# Patient Record
Sex: Male | Born: 1955 | Race: White | Hispanic: No | Marital: Single | Smoking: Never smoker
Health system: Southern US, Community
[De-identification: ages and names within clinical notes are randomized; demographics above are authoritative.]

---

## 1998-05-08 ENCOUNTER — Other Ambulatory Visit: Admission: RE | Admit: 1998-05-08 | Discharge: 1998-05-08 | Payer: Self-pay | Admitting: General Surgery

## 2002-04-27 ENCOUNTER — Emergency Department (HOSPITAL_COMMUNITY): Admission: EM | Admit: 2002-04-27 | Discharge: 2002-04-27 | Payer: Self-pay | Admitting: Emergency Medicine

## 2002-04-27 ENCOUNTER — Encounter: Payer: Self-pay | Admitting: Emergency Medicine

## 2002-10-28 ENCOUNTER — Encounter: Payer: Self-pay | Admitting: Family Medicine

## 2002-10-28 ENCOUNTER — Encounter: Admission: RE | Admit: 2002-10-28 | Discharge: 2002-10-28 | Payer: Self-pay | Admitting: Family Medicine

## 2003-10-06 DIAGNOSIS — D229 Melanocytic nevi, unspecified: Secondary | ICD-10-CM

## 2003-10-06 HISTORY — DX: Melanocytic nevi, unspecified: D22.9

## 2008-07-18 DIAGNOSIS — C4492 Squamous cell carcinoma of skin, unspecified: Secondary | ICD-10-CM

## 2008-07-18 HISTORY — DX: Squamous cell carcinoma of skin, unspecified: C44.92

## 2008-10-17 DIAGNOSIS — C4491 Basal cell carcinoma of skin, unspecified: Secondary | ICD-10-CM

## 2008-10-17 HISTORY — DX: Basal cell carcinoma of skin, unspecified: C44.91

## 2008-11-28 DIAGNOSIS — C4491 Basal cell carcinoma of skin, unspecified: Secondary | ICD-10-CM

## 2008-11-28 HISTORY — DX: Basal cell carcinoma of skin, unspecified: C44.91

## 2009-03-04 ENCOUNTER — Encounter: Admission: RE | Admit: 2009-03-04 | Discharge: 2009-03-04 | Payer: Self-pay | Admitting: Otolaryngology

## 2009-03-16 ENCOUNTER — Encounter: Admission: RE | Admit: 2009-03-16 | Discharge: 2009-03-16 | Payer: Self-pay | Admitting: Otolaryngology

## 2010-03-29 ENCOUNTER — Emergency Department (HOSPITAL_COMMUNITY)
Admission: EM | Admit: 2010-03-29 | Discharge: 2010-03-29 | Payer: Self-pay | Source: Home / Self Care | Admitting: Emergency Medicine

## 2010-03-30 ENCOUNTER — Encounter
Admission: RE | Admit: 2010-03-30 | Discharge: 2010-04-17 | Payer: Self-pay | Source: Home / Self Care | Attending: Family Medicine | Admitting: Family Medicine

## 2010-04-02 LAB — DIFFERENTIAL
Basophils Absolute: 0 10*3/uL (ref 0.0–0.1)
Basophils Relative: 0 % (ref 0–1)
Eosinophils Absolute: 0.1 10*3/uL (ref 0.0–0.7)
Eosinophils Relative: 1 % (ref 0–5)
Lymphocytes Relative: 34 % (ref 12–46)
Lymphs Abs: 2.2 10*3/uL (ref 0.7–4.0)
Monocytes Absolute: 0.6 10*3/uL (ref 0.1–1.0)
Monocytes Relative: 9 % (ref 3–12)
Neutro Abs: 3.4 10*3/uL (ref 1.7–7.7)
Neutrophils Relative %: 55 % (ref 43–77)

## 2010-04-02 LAB — CBC
HCT: 41.2 % (ref 39.0–52.0)
Hemoglobin: 14 g/dL (ref 13.0–17.0)
MCH: 29.5 pg (ref 26.0–34.0)
MCHC: 34 g/dL (ref 30.0–36.0)
MCV: 86.7 fL (ref 78.0–100.0)
Platelets: 295 10*3/uL (ref 150–400)
RBC: 4.75 MIL/uL (ref 4.22–5.81)
RDW: 13.1 % (ref 11.5–15.5)
WBC: 6.3 10*3/uL (ref 4.0–10.5)

## 2010-04-02 LAB — BASIC METABOLIC PANEL
BUN: 23 mg/dL (ref 6–23)
CO2: 27 mEq/L (ref 19–32)
Calcium: 9.3 mg/dL (ref 8.4–10.5)
Chloride: 106 mEq/L (ref 96–112)
Creatinine, Ser: 1.07 mg/dL (ref 0.4–1.5)
GFR calc Af Amer: >60 mL/min (ref >60)
GFR calc non Af Amer: >60 mL/min (ref >60)
Glucose, Bld: 104 mg/dL — ABNORMAL HIGH (ref 70–99)
Potassium: 4.1 mEq/L (ref 3.5–5.1)
Sodium: 140 mEq/L (ref 135–145)

## 2010-04-24 ENCOUNTER — Encounter: Payer: Self-pay | Admitting: Rehabilitative and Restorative Service Providers"

## 2010-05-07 ENCOUNTER — Ambulatory Visit
Payer: PRIVATE HEALTH INSURANCE | Attending: Family Medicine | Admitting: Rehabilitative and Restorative Service Providers"

## 2010-05-07 DIAGNOSIS — R42 Dizziness and giddiness: Secondary | ICD-10-CM | POA: Insufficient documentation

## 2010-05-07 DIAGNOSIS — R269 Unspecified abnormalities of gait and mobility: Secondary | ICD-10-CM | POA: Insufficient documentation

## 2010-05-07 DIAGNOSIS — IMO0001 Reserved for inherently not codable concepts without codable children: Secondary | ICD-10-CM | POA: Insufficient documentation

## 2010-08-01 ENCOUNTER — Ambulatory Visit: Payer: PRIVATE HEALTH INSURANCE | Attending: Neurology | Admitting: Physical Therapy

## 2010-08-01 DIAGNOSIS — M2569 Stiffness of other specified joint, not elsewhere classified: Secondary | ICD-10-CM | POA: Insufficient documentation

## 2010-08-01 DIAGNOSIS — M542 Cervicalgia: Secondary | ICD-10-CM | POA: Insufficient documentation

## 2010-08-01 DIAGNOSIS — IMO0001 Reserved for inherently not codable concepts without codable children: Secondary | ICD-10-CM | POA: Insufficient documentation

## 2010-08-06 ENCOUNTER — Ambulatory Visit: Payer: PRIVATE HEALTH INSURANCE | Admitting: Physical Therapy

## 2010-08-09 ENCOUNTER — Ambulatory Visit: Payer: PRIVATE HEALTH INSURANCE | Admitting: Physical Therapy

## 2010-08-15 ENCOUNTER — Encounter: Payer: PRIVATE HEALTH INSURANCE | Admitting: Physical Therapy

## 2010-08-20 ENCOUNTER — Ambulatory Visit: Payer: PRIVATE HEALTH INSURANCE | Attending: Neurology | Admitting: Physical Therapy

## 2010-08-20 DIAGNOSIS — IMO0001 Reserved for inherently not codable concepts without codable children: Secondary | ICD-10-CM | POA: Insufficient documentation

## 2010-08-20 DIAGNOSIS — M542 Cervicalgia: Secondary | ICD-10-CM | POA: Insufficient documentation

## 2010-08-20 DIAGNOSIS — M2569 Stiffness of other specified joint, not elsewhere classified: Secondary | ICD-10-CM | POA: Insufficient documentation

## 2010-08-23 ENCOUNTER — Ambulatory Visit: Payer: PRIVATE HEALTH INSURANCE | Admitting: Physical Therapy

## 2010-08-28 ENCOUNTER — Ambulatory Visit: Payer: PRIVATE HEALTH INSURANCE | Admitting: Physical Therapy

## 2010-08-30 ENCOUNTER — Ambulatory Visit: Payer: PRIVATE HEALTH INSURANCE | Admitting: Physical Therapy

## 2010-09-03 ENCOUNTER — Ambulatory Visit: Payer: PRIVATE HEALTH INSURANCE | Admitting: Physical Therapy

## 2010-09-06 ENCOUNTER — Ambulatory Visit: Payer: PRIVATE HEALTH INSURANCE | Admitting: Physical Therapy

## 2010-09-10 ENCOUNTER — Encounter: Payer: PRIVATE HEALTH INSURANCE | Admitting: Physical Therapy

## 2010-09-13 ENCOUNTER — Encounter: Payer: PRIVATE HEALTH INSURANCE | Admitting: Physical Therapy

## 2011-07-01 ENCOUNTER — Ambulatory Visit (INDEPENDENT_AMBULATORY_CARE_PROVIDER_SITE_OTHER): Payer: PRIVATE HEALTH INSURANCE | Admitting: Family Medicine

## 2011-07-01 VITALS — BP 131/81 | HR 62 | Temp 97.8°F | Resp 16 | Ht 70.0 in | Wt 218.0 lb

## 2011-07-01 DIAGNOSIS — J040 Acute laryngitis: Secondary | ICD-10-CM

## 2011-07-01 DIAGNOSIS — J069 Acute upper respiratory infection, unspecified: Secondary | ICD-10-CM

## 2011-07-01 MED ORDER — PREDNISONE 20 MG PO TABS: ORAL_TABLET | ORAL | Status: DC

## 2011-07-01 MED ORDER — AMOXICILLIN 875 MG PO TABS: 875.0000 mg | ORAL_TABLET | Freq: Two times a day (BID) | ORAL | Status: AC

## 2011-07-01 NOTE — Progress Notes (Signed)
Subjective: Patient gives a 5 day history of having a sore throat, then a couple days later developing laryngitis. He has been coughing some. Today his throat is more sore again. He is clearing his throat some. 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  Objective: TMs normal. Throat mildly erythematous. Neck supple without significant nodes. Chest clear. Heart regular. He is a little hoarse.  Assessment: Laryngitis and URI and probable sinusitis  Plan: Says has been going on for 5 days to treat with amoxicillin. A brief three-day course of prednisone 60 mg a day for 3 days. He can take the Mucinex he has at home. Return if worse. Thank you

## 2012-09-14 IMAGING — CT CT CERVICAL SPINE W/O CM
3 of 5 series · 9 of 20 positions shown, 10 images · non-contrast
Comparison: MR 03/04/2009

CT HEAD

CLINICAL DATA: *Trauma (Fall) - Pain Dizziness. PREVIOUS SURGICAL
THE CERVICAL FUSION

CT HEAD WITHOUT CONTRAST
CT CERVICAL SPINE WITHOUT CONTRAST
TECHNIQUE: Multidetector CT imaging of the head and cervical spine
was performed following the standard protocol without IV contrast.
Multiplanar CT image reconstructions of the cervical spine were
also generated.

[Series 5: c_spine 2.0 b31s · axial · 0.28mm/px · z∈[-280,-172]mm · 3 of 108 slices shown]
[im 27/108  bone]
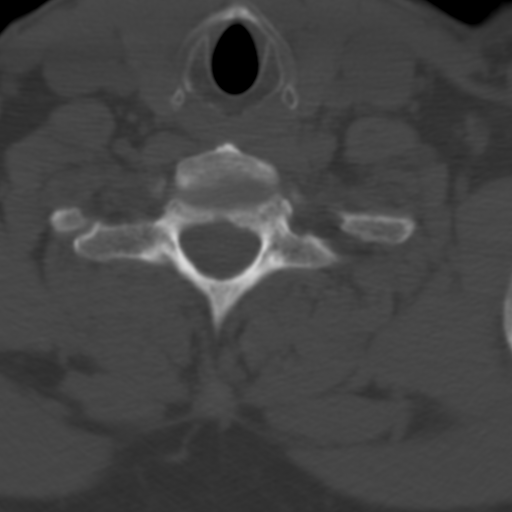
[im 54/108  bone]
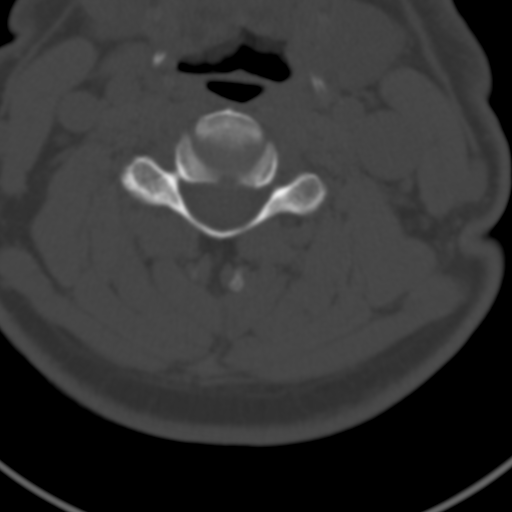
[im 81/108  bone]
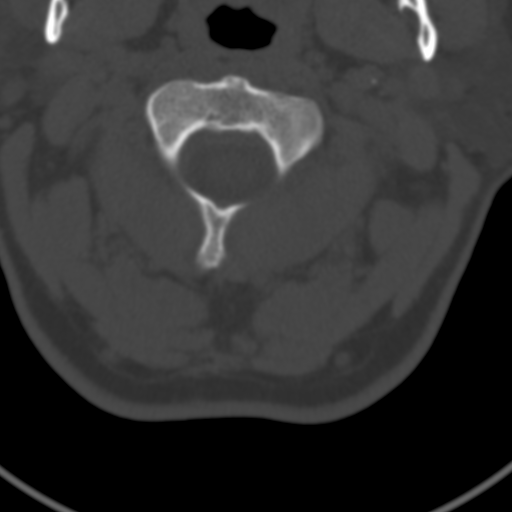

[Series 604: <mpr thick range(2)> · coronal · 0.42mm/px · 3 of 43 slices shown]
[im 9/43  bone]
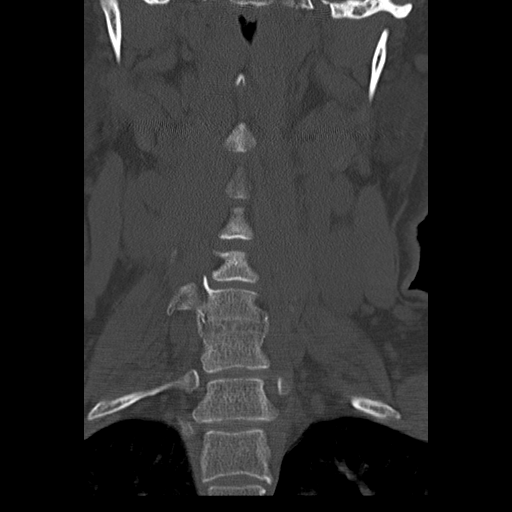
[im 17/43  bone]
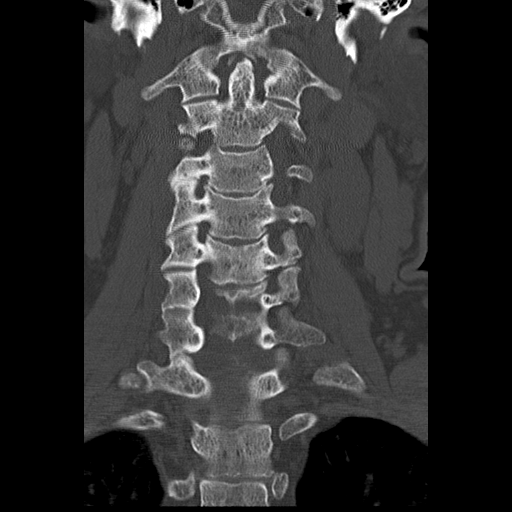
[im 26/43  bone]
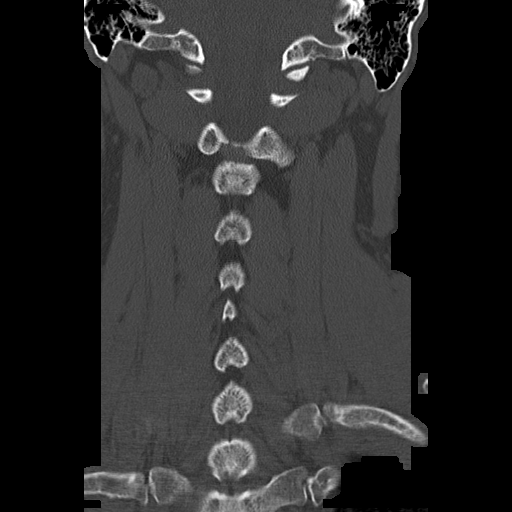

[Series 605: <mpr thick range(3)> · axial · 0.42mm/px · z∈[-309,-194]mm · 3 of 116 slices shown, 4 images]
[im 29/116  soft-tissue]
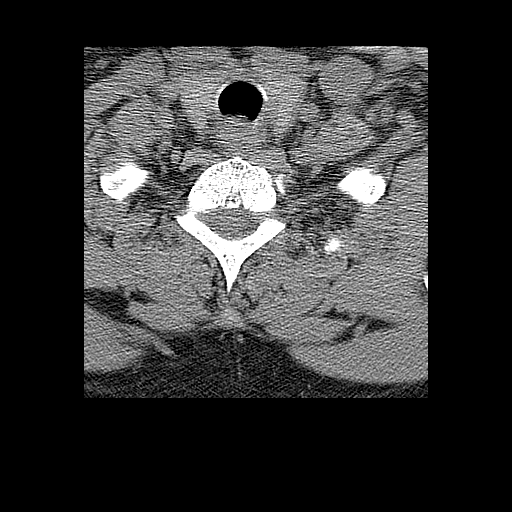
[im 29/116  bone]
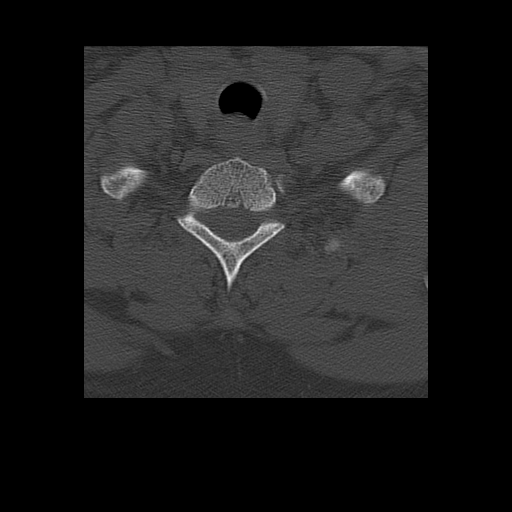
[im 58/116  bone]
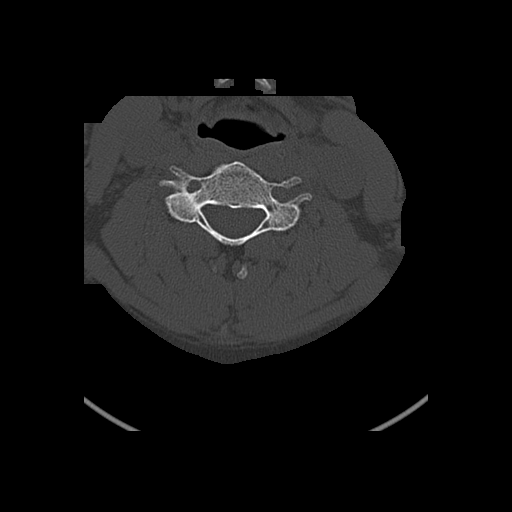
[im 87/116  bone]
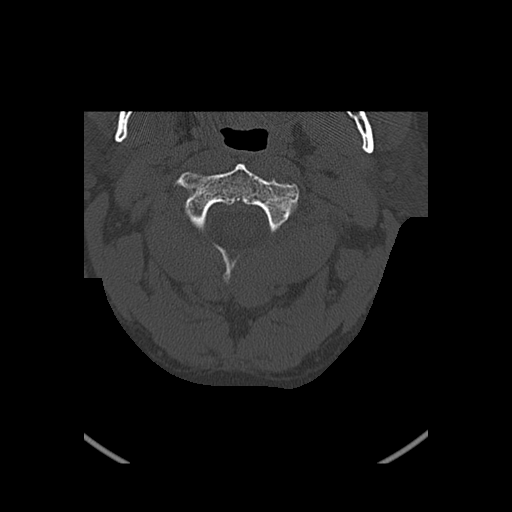

[9 of 20 positions shown; findings below may reference images not displayed]

FINDINGS: There is no evidence of acute intracranial hemorrhage,
brain edema, mass lesion, acute infarction,   mass effect, or
midline shift. Acute infarct may be inapparent on noncontrast CT.
No other intra-axial abnormalities are seen, and the ventricles and
sulci are within normal limits in size and symmetry.   No abnormal
extra-axial fluid collections or masses are identified.  No
significant calvarial abnormality.
IMPRESSION: 1. Negative for bleed or other acute intracranial process.

CT CERVICAL SPINE
FINDINGS: Previous interbody fusion C6-7.  There is straightening
of the normal cervical lordosis.  No prevertebral soft tissue
swelling.  Facets seated bilaterally.  There is mild narrowing of
the C3-4, C4-5, C5-6 interspaces with small posterior protrusions
and associated endplate spurring.

Negative for fracture.

Visualized lung apices clear.
IMPRESSION: 1.  Negative for fracture or other acute bony abnormality.
2.  Degenerative and postop changes as above.
3. Loss of the normal cervical spine lordosis, which may be
secondary to positioning, spasm, or soft tissue injury.

## 2013-07-27 ENCOUNTER — Encounter: Payer: Self-pay | Admitting: Internal Medicine

## 2014-05-26 ENCOUNTER — Other Ambulatory Visit: Payer: Self-pay | Admitting: Family Medicine

## 2014-05-26 DIAGNOSIS — M5412 Radiculopathy, cervical region: Secondary | ICD-10-CM

## 2014-07-08 ENCOUNTER — Other Ambulatory Visit: Payer: PRIVATE HEALTH INSURANCE

## 2014-07-12 ENCOUNTER — Ambulatory Visit
Admission: RE | Admit: 2014-07-12 | Discharge: 2014-07-12 | Disposition: A | Discharge status: Home / Self Care | Payer: PRIVATE HEALTH INSURANCE | Source: Ambulatory Visit | Attending: Family Medicine | Admitting: Family Medicine

## 2014-07-12 DIAGNOSIS — M5412 Radiculopathy, cervical region: Secondary | ICD-10-CM

## 2014-08-29 DIAGNOSIS — C4492 Squamous cell carcinoma of skin, unspecified: Secondary | ICD-10-CM

## 2014-08-29 HISTORY — DX: Squamous cell carcinoma of skin, unspecified: C44.92

## 2014-11-11 ENCOUNTER — Ambulatory Visit: Payer: PRIVATE HEALTH INSURANCE | Admitting: Podiatry

## 2014-11-14 ENCOUNTER — Ambulatory Visit (INDEPENDENT_AMBULATORY_CARE_PROVIDER_SITE_OTHER): Payer: PRIVATE HEALTH INSURANCE | Admitting: Podiatry

## 2014-11-14 ENCOUNTER — Ambulatory Visit (INDEPENDENT_AMBULATORY_CARE_PROVIDER_SITE_OTHER): Payer: PRIVATE HEALTH INSURANCE

## 2014-11-14 ENCOUNTER — Encounter: Payer: Self-pay | Admitting: Podiatry

## 2014-11-14 VITALS — BP 119/70 | HR 61 | Resp 16 | Ht 71.0 in | Wt 235.0 lb

## 2014-11-14 DIAGNOSIS — M722 Plantar fascial fibromatosis: Secondary | ICD-10-CM | POA: Diagnosis not present

## 2014-11-14 MED ORDER — TRIAMCINOLONE ACETONIDE 10 MG/ML IJ SUSP
10.0000 mg | Freq: Once | INTRAMUSCULAR | Status: AC
  Administered 2014-11-14: 10 mg

## 2014-11-14 MED ORDER — DICLOFENAC SODIUM 75 MG PO TBEC: 75.0000 mg | DELAYED_RELEASE_TABLET | Freq: Two times a day (BID) | ORAL | Status: DC

## 2014-11-14 NOTE — Patient Instructions (Signed)

## 2014-11-14 NOTE — Progress Notes (Signed)
   Subjective:    Patient ID: Gregory Gallagher, male    DOB: July 10, 1955, 59 y.o.   MRN: [id number]  HPI Patient presents with bilateral foot pain, heel. More pain in the left foot. This has been going on for the past 5 weeks.  Pt does wear orthotics.  Review of Systems  HENT: Positive for tinnitus.   Neurological: Positive for dizziness.       Objective:   Physical Exam        Assessment & Plan:

## 2014-11-28 ENCOUNTER — Encounter: Payer: Self-pay | Admitting: Podiatry

## 2014-11-28 ENCOUNTER — Ambulatory Visit (INDEPENDENT_AMBULATORY_CARE_PROVIDER_SITE_OTHER): Payer: PRIVATE HEALTH INSURANCE | Admitting: Podiatry

## 2014-11-28 VITALS — BP 118/71 | HR 61 | Resp 16

## 2014-11-28 DIAGNOSIS — M722 Plantar fascial fibromatosis: Secondary | ICD-10-CM

## 2014-11-28 MED ORDER — TRIAMCINOLONE ACETONIDE 10 MG/ML IJ SUSP
10.0000 mg | Freq: Once | INTRAMUSCULAR | Status: AC
  Administered 2014-11-28: 10 mg

## 2014-11-29 NOTE — Progress Notes (Signed)
Subjective:     Patient ID: Gregory Gallagher, male   DOB: 09-Feb-1956, 59 y.o.   MRN: [id number]  HPI patient states I'm still getting some pain but it's improved in the left heel. I also would like long-term orthotic doesn't have them in the past and I need a new pair   Review of Systems     Objective:   Physical Exam Neurovascular status intact muscle strength adequate with discomfort plantar left at the insertional point tendon the calcaneus with moderate depression of the arch upon weightbearing    Assessment:     Acute plantar fasciitis still present but improved with discomfort and change in the arch height itself    Plan:     Injected the plantar fascia 3 Milligan Kenalog 5 mg Xylocaine and scanned for custom orthotics to reduce stress against the feet. Reappoint when returned

## 2014-12-21 ENCOUNTER — Encounter: Payer: Self-pay | Admitting: Podiatry

## 2014-12-21 ENCOUNTER — Ambulatory Visit (INDEPENDENT_AMBULATORY_CARE_PROVIDER_SITE_OTHER): Payer: PRIVATE HEALTH INSURANCE | Admitting: Podiatry

## 2014-12-21 VITALS — BP 115/71 | HR 65 | Resp 16

## 2014-12-21 DIAGNOSIS — B351 Tinea unguium: Secondary | ICD-10-CM | POA: Diagnosis not present

## 2014-12-21 DIAGNOSIS — M722 Plantar fascial fibromatosis: Secondary | ICD-10-CM | POA: Diagnosis not present

## 2014-12-21 NOTE — Patient Instructions (Signed)

## 2014-12-21 NOTE — Progress Notes (Signed)
Subjective:     Patient ID: Gregory Gallagher, male   DOB: 09-Sep-1955, 59 y.o.   MRN: [id number]  HPI patient states my heel is improved but still sore and I had questions about this big toenail on my right foot   Review of Systems     Objective:   Physical Exam Neurovascular status intact with discomfort in the plantar heel left that's improved significantly with pain still upon deep palpation and yellow brittle nailbeds of the right big toenail which is probably trauma and combination fungus infection    Assessment:     Plantar fasciitis left improving but still present and mycotic nail infection with probable trauma right big toe    Plan:     Reviewed both conditions and discussed physical therapy for the left and continued orthotic usage and for the right I reviewed considerations for laser pulse Lamisil therapy and utilization of topicals. Patient will decide and we'll get this done and eventually in the future

## 2015-09-07 ENCOUNTER — Encounter: Payer: Self-pay | Admitting: Podiatry

## 2015-09-07 ENCOUNTER — Ambulatory Visit (INDEPENDENT_AMBULATORY_CARE_PROVIDER_SITE_OTHER): Payer: PRIVATE HEALTH INSURANCE | Admitting: Podiatry

## 2015-09-07 VITALS — BP 112/88 | HR 69 | Resp 12

## 2015-09-07 DIAGNOSIS — M722 Plantar fascial fibromatosis: Secondary | ICD-10-CM

## 2015-09-07 MED ORDER — TRIAMCINOLONE ACETONIDE 10 MG/ML IJ SUSP
10.0000 mg | Freq: Once | INTRAMUSCULAR | Status: AC
  Administered 2015-09-07: 10 mg

## 2015-09-07 NOTE — Patient Instructions (Signed)

## 2015-09-08 NOTE — Progress Notes (Signed)
Subjective:     Patient ID: Gregory Gallagher, male   DOB: 1955/06/17, 60 y.o.   MRN: [id number]  HPI patient states my heel has started to hurt me quite a bit again. States it's worse when getting up in the morning and after sitting and he feels that it needs to be stretch better   Review of Systems     Objective:   Physical Exam Neurovascular status intact muscle strength adequate with inflammation of the right plantar fashion at the insertion of the tendon into the calcaneus that especially is sore with ambulation first up in the morning and after periods of sitting    Assessment:     Acute plantar fasciitis right    Plan:     Discussed continuation of conservative care and carefully reinjected the plantar fascial right 3 mg Kenalog 5 mill grams Xylocaine and applied sterile dressing and then went ahead and placed into a night splint with instructions on usage

## 2015-10-04 ENCOUNTER — Ambulatory Visit (INDEPENDENT_AMBULATORY_CARE_PROVIDER_SITE_OTHER): Payer: PRIVATE HEALTH INSURANCE | Admitting: Podiatry

## 2015-10-04 ENCOUNTER — Encounter: Payer: Self-pay | Admitting: Podiatry

## 2015-10-04 DIAGNOSIS — M722 Plantar fascial fibromatosis: Secondary | ICD-10-CM | POA: Diagnosis not present

## 2015-10-04 MED ORDER — TRIAMCINOLONE ACETONIDE 10 MG/ML IJ SUSP
10.0000 mg | Freq: Once | INTRAMUSCULAR | Status: AC
  Administered 2015-10-04: 10 mg

## 2015-10-04 NOTE — Progress Notes (Signed)
Subjective:     Patient ID: Gregory Gallagher, male   DOB: [birth year], 60 y.o.   MRN: [id number]  HPI patient states my heel is still killing me on my right and it is affecting everything that I want to do   Review of Systems     Objective:   Physical Exam Neurovascular status intact muscle strength adequate with inflammatory changes plantar aspect right heel that he states does not allow him to be as active as he would like her walk is spasticity wants to walk    Assessment:     Acute plantar fasciitis right with inflammation fluid buildup    Plan:     Discussed treatment options at this time and due to long-term nature I allowed him to read a consent form for correction. I reviewed alternative treatments complications and patient wants surgery and signs consent form and will call as he is able to do the procedure. We also discussed possibility for shockwave and I educated him on this and I went ahead today and I reinjected the plantar fascia 3 Milligan Kenalog 5 mg Xylocaine and applied air fracture walker to immobilize to see if we can get this better and not require the surgery. Reappoint 4 weeks to reevaluate or earlier if needed

## 2015-11-01 ENCOUNTER — Ambulatory Visit: Payer: PRIVATE HEALTH INSURANCE | Admitting: Podiatry

## 2019-03-10 ENCOUNTER — Other Ambulatory Visit: Payer: Self-pay

## 2019-03-10 ENCOUNTER — Encounter (INDEPENDENT_AMBULATORY_CARE_PROVIDER_SITE_OTHER): Payer: Self-pay | Admitting: Otolaryngology

## 2019-03-10 ENCOUNTER — Ambulatory Visit (INDEPENDENT_AMBULATORY_CARE_PROVIDER_SITE_OTHER): Payer: PRIVATE HEALTH INSURANCE | Admitting: Otolaryngology

## 2019-03-10 VITALS — Temp 97.7°F

## 2019-03-10 DIAGNOSIS — D170 Benign lipomatous neoplasm of skin and subcutaneous tissue of head, face and neck: Secondary | ICD-10-CM | POA: Diagnosis not present

## 2019-03-10 NOTE — Progress Notes (Signed)
HPI: Gregory Gallagher is a 63 y.o. male who presents is referred by Dr. Rex Kras for evaluation of left posterior neck nodule.  He just initially noticed this several weeks ago when he was feeling behind his neck.  The nodule is otherwise asymptomatic.  He has had history of basal cell carcinoma removed from the right side of the neck as well as from the left preauricular area.  No history of melanoma no history of squamous cell carcinoma of the skin. Dr. Rex Kras referred patient here to have this evaluated and thought it probably represented a lipoma..  No past medical history on file. No past surgical history on file. Social History   Socioeconomic History  . Marital status: Single    Spouse name: Not on file  . Number of children: Not on file  . Years of education: Not on file  . Highest education level: Not on file  Occupational History  . Not on file  Tobacco Use  . Smoking status: Never Smoker  . Smokeless tobacco: Never Used  Substance and Sexual Activity  . Alcohol use: Not on file  . Drug use: Not on file  . Sexual activity: Not on file  Other Topics Concern  . Not on file  Social History Narrative  . Not on file   Social Determinants of Health   Financial Resource Strain:   . Difficulty of Paying Living Expenses: Not on file  Food Insecurity:   . Worried About Charity fundraiser in the Last Year: Not on file  . Ran Out of Food in the Last Year: Not on file  Transportation Needs:   . Lack of Transportation (Medical): Not on file  . Lack of Transportation (Non-Medical): Not on file  Physical Activity:   . Days of Exercise per Week: Not on file  . Minutes of Exercise per Session: Not on file  Stress:   . Feeling of Stress : Not on file  Social Connections:   . Frequency of Communication with Friends and Family: Not on file  . Frequency of Social Gatherings with Friends and Family: Not on file  . Attends Religious Services: Not on file  . Active Member of Clubs or  Organizations: Not on file  . Attends Archivist Meetings: Not on file  . Marital Status: Not on file   No family history on file. No Known Allergies Prior to Admission medications   Medication Sig Start Date End Date Taking? Authorizing Provider  clobetasol cream (TEMOVATE) 0.05 % APPLY TO AFFECTED AREA EVERY DAY 09/21/14  Yes [provider]  diclofenac (VOLTAREN) 75 MG EC tablet Take 1 tablet (75 mg total) by mouth 2 (two) times daily. 11/14/14  Yes Regal, Tamala Fothergill, DPM     Positive ROS: Otherwise negative  All other systems have been reviewed and were otherwise negative with the exception of those mentioned in the HPI and as above.  Physical Exam: Constitutional: Alert, well-appearing, no acute distress Ears: External ears without lesions or tenderness. Ear canals are clear bilaterally with intact, clear TMs.  Nasal: External nose without lesions. Clear nasal passages Oral: Lips and gums without lesions. Tongue and palate mucosa without lesions. Posterior oropharynx clear. Neck: Patient has a soft 2-2 and half centimeter left posterior neck subcutaneous nodule consistent with probable lipoma.  This is just to the left of the cervical spine.  There are no posterior neck skin lesions noted.  No palpable adenopathy in the anterior neck along the jugular chain  of lymph nodes. Respiratory: Breathing comfortably  Skin: No facial/neck lesions or rash noted.  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  This can be excised under local anesthetic if needed but at this point since it is otherwise asymptomatic would recommend just observation.  If it enlarges or causes Mr. Ogaz any trouble he will call us back to schedule excision.   Radene Journey, MD   CC:

## 2019-10-20 ENCOUNTER — Ambulatory Visit (INDEPENDENT_AMBULATORY_CARE_PROVIDER_SITE_OTHER): Payer: Commercial Managed Care - PPO | Admitting: Dermatology

## 2019-10-20 ENCOUNTER — Encounter: Payer: Self-pay | Admitting: Dermatology

## 2019-10-20 ENCOUNTER — Other Ambulatory Visit: Payer: Self-pay

## 2019-10-20 DIAGNOSIS — Z86007 Personal history of in-situ neoplasm of skin: Secondary | ICD-10-CM

## 2019-10-20 DIAGNOSIS — D225 Melanocytic nevi of trunk: Secondary | ICD-10-CM | POA: Diagnosis not present

## 2019-10-20 DIAGNOSIS — Z85828 Personal history of other malignant neoplasm of skin: Secondary | ICD-10-CM

## 2019-10-20 DIAGNOSIS — Z1283 Encounter for screening for malignant neoplasm of skin: Secondary | ICD-10-CM

## 2019-10-20 DIAGNOSIS — D229 Melanocytic nevi, unspecified: Secondary | ICD-10-CM

## 2019-12-04 ENCOUNTER — Encounter: Payer: Self-pay | Admitting: Dermatology

## 2019-12-30 NOTE — Progress Notes (Addendum)
   Follow-Up Visit   Subjective  Gregory Gallagher is a 64 y.o. male who presents for the following: Follow-up (6 months--skin check.).  General skin exam.  Location:  Duration:  Quality:  Associated Signs/Symptoms: Modifying Factors:  Severity:  Timing: Context:   Objective  Well appearing patient in no apparent distress; mood and affect are within normal limits.  All skin waist up examined.   Assessment & Plan    Screening for malignant neoplasm of skin Mid Back  Yearly Skin exams.   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    I, Lavonna Monarch, MD, have reviewed all documentation for this visit.  The documentation on 01/07/20 for the exam, diagnosis, procedures, and orders are all accurate and complete.

## 2020-01-31 ENCOUNTER — Other Ambulatory Visit: Payer: Self-pay

## 2020-01-31 ENCOUNTER — Ambulatory Visit (INDEPENDENT_AMBULATORY_CARE_PROVIDER_SITE_OTHER): Payer: Commercial Managed Care - PPO | Admitting: Dermatology

## 2020-01-31 ENCOUNTER — Encounter: Payer: Self-pay | Admitting: Dermatology

## 2020-01-31 DIAGNOSIS — Z86007 Personal history of in-situ neoplasm of skin: Secondary | ICD-10-CM

## 2020-01-31 DIAGNOSIS — Z86018 Personal history of other benign neoplasm: Secondary | ICD-10-CM

## 2020-01-31 DIAGNOSIS — D485 Neoplasm of uncertain behavior of skin: Secondary | ICD-10-CM

## 2020-01-31 DIAGNOSIS — Z85828 Personal history of other malignant neoplasm of skin: Secondary | ICD-10-CM | POA: Diagnosis not present

## 2020-01-31 DIAGNOSIS — L570 Actinic keratosis: Secondary | ICD-10-CM

## 2020-01-31 MED ORDER — TOLAK 4 % EX CREA: 1.0000 "application " | TOPICAL_CREAM | Freq: Every evening | CUTANEOUS | 0 refills | Status: DC

## 2020-01-31 NOTE — Patient Instructions (Addendum)
Biopsy, Surgery (Curettage) & Surgery (Excision) Aftercare Instructions  1. Okay to remove bandage in 24 hours  2. Wash area with soap and water  3. Apply Vaseline to area twice daily until healed (Not Neosporin)  4. Okay to cover with a Band-Aid to decrease the chance of infection or prevent irritation from clothing; also it's okay to uncover lesion at home.  5. Suture instructions: return to our office in 7-10 or 10-14 days for a nurse visit for suture removal. Variable healing with sutures, if pain or itching occurs call our office. It's okay to shower or bathe 24 hours after sutures are given.  6. The following risks may occur after a biopsy, curettage or excision: bleeding, scarring, discoloration, recurrence, infection (redness, yellow drainage, pain or swelling).  7. For questions, concerns and results call our office at [place] before 4pm & Friday before 3pm. Biopsy results will be available in 1 week.  [facility] tomorrow if they haven't contacted you by the end of the day today.

## 2020-02-03 ENCOUNTER — Encounter: Payer: Self-pay | Admitting: Dermatology

## 2020-02-03 NOTE — Progress Notes (Signed)
   Follow-Up Visit   Subjective  Gregory Gallagher is a 64 y.o. male who presents for the following: Follow-up (Patient here today for spot on the left outer back x 1 month. Per patient no bleeding, some itching, no pain).  General skin check Location:  Duration:  Quality:  Associated Signs/Symptoms: Modifying Factors:  Severity:  Timing: Context: History of skin cancer  Objective  Well appearing patient in no apparent distress; mood and affect are within normal limits.  A full examination was performed including scalp, head, eyes, ears, nose, lips, neck, chest, axillae, abdomen, back, buttocks, bilateral upper extremities, bilateral lower extremities, hands, feet, fingers, toes, fingernails, and toenails. All findings within normal limits unless otherwise noted below.   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  Annual skin examination  History of basal cell carcinoma (BCC) Right Anterior Neck  Annual skin examination  AK (actinic keratosis) (3) Mid Forehead; Left Forehead; Right Forehead  Destruction of lesion - Left Forehead, Mid Forehead, Right Forehead Complexity: simple   Destruction method: cryotherapy   Informed consent: discussed and consent obtained   Timeout:  patient name, date of birth, surgical site, and procedure verified Lesion destroyed using liquid nitrogen: Yes   Cryotherapy cycles:  5 Outcome: patient tolerated procedure well with no complications   Post-procedure details: wound care instructions given    Fluorouracil (TOLAK) 4 % CREA - Left Forehead  Neoplasm of uncertain behavior of skin (2) Left Upper Flank (2)  Skin / nail biopsy Type of biopsy: tangential   Informed consent: discussed and consent obtained   Timeout: patient name, date of birth, surgical site, and procedure verified   Procedure prep:  Patient  was prepped and draped in usual sterile fashion (Non sterile) Prep type:  Chlorhexidine Anesthesia: the lesion was anesthetized in a standard fashion   Anesthetic:  1% lidocaine w/ epinephrine 1-100,000 local infiltration Instrument used: flexible razor blade   Outcome: patient tolerated procedure well   Post-procedure details: sterile dressing applied and wound care instructions given   Dressing type: bandage and petrolatum    Specimen 1 - Surgical pathology Differential Diagnosis: inverse psoriasis. Rule out: subacute LE, CTCL. Check Margins: No  2 shave biopsies obtained from 2 separate areas of lesion.      I, Lavonna Monarch, MD, have reviewed all documentation for this visit.  The documentation on 02/03/20 for the exam, diagnosis, procedures, and orders are all accurate and complete.

## 2020-02-04 ENCOUNTER — Telehealth: Payer: Self-pay

## 2020-02-04 NOTE — Telephone Encounter (Signed)
Phone call to patient with his pathology results and Dr. Onalee Hua recommendations.  Per patient the spot isn't itching at this time and he'll call back if it does start to itch.

## 2020-02-04 NOTE — Telephone Encounter (Signed)
-----   Message from Lavonna Monarch, MD sent at 02/04/2020  5:15 AM EST ----- Please let Gregory Gallagher know that the biopsy essentially rules out infection or skin cancer.  However, it is not entirely specific.  If there is no itching I would simply watch the spot.  If there is itching, he may try a little clobetasol cream on it daily for 3 weeks.  Please contact us in 3 weeks with a follow-up status.

## 2020-04-19 ENCOUNTER — Ambulatory Visit: Payer: Commercial Managed Care - PPO | Admitting: Dermatology

## 2020-07-31 ENCOUNTER — Encounter: Payer: Self-pay | Admitting: Dermatology

## 2020-07-31 ENCOUNTER — Other Ambulatory Visit: Payer: Self-pay

## 2020-07-31 ENCOUNTER — Ambulatory Visit (INDEPENDENT_AMBULATORY_CARE_PROVIDER_SITE_OTHER): Payer: Commercial Managed Care - PPO | Admitting: Dermatology

## 2020-07-31 DIAGNOSIS — D229 Melanocytic nevi, unspecified: Secondary | ICD-10-CM

## 2020-07-31 DIAGNOSIS — L570 Actinic keratosis: Secondary | ICD-10-CM

## 2020-07-31 DIAGNOSIS — D1800 Hemangioma unspecified site: Secondary | ICD-10-CM

## 2020-07-31 DIAGNOSIS — Z1283 Encounter for screening for malignant neoplasm of skin: Secondary | ICD-10-CM | POA: Diagnosis not present

## 2020-08-13 ENCOUNTER — Encounter: Payer: Self-pay | Admitting: Dermatology

## 2020-08-13 NOTE — Progress Notes (Signed)
   Follow-Up Visit   Subjective  Gregory Gallagher is a 65 y.o. male who presents for the following: Follow-up (F/u tolak on face -good reaction).  However persistent crusts, check moles on back Location:  Duration:  Quality:  Associated Signs/Symptoms: Modifying Factors:  Severity:  Timing: Context:   Objective  Well appearing patient in no apparent distress; mood and affect are within normal limits. Objective  Left Abdomen (side) - Upper: Waist up skin examination, no atypical pigmented lesions or recurrent nonmelanoma skin cancer  Objective  Right Lower Back: 8mm violet colored smooth papule, dermoscopy confirms  Objective  Left Posterior Neck, Mid Forehead, Right Forehead, Right Parotid Area: Overall 70% improvement with topical fluorouracil.  Few thicker persistent pink crusts will be treated with freezing.    All skin waist up examined.   Assessment & Plan    Encounter for screening for malignant neoplasm of skin Left Abdomen (side) - Upper  Annual skin examination.  Hemangioma, unspecified site Right Lower Back  Intervention not necessary  AK (actinic keratosis) (4) Mid Forehead; Right Parotid Area; Left Posterior Neck; Right Forehead  Destruction of lesion - Left Posterior Neck, Mid Forehead, Right Forehead, Right Parotid Area Complexity: simple   Destruction method: cryotherapy   Informed consent: discussed and consent obtained   Timeout:  patient name, date of birth, surgical site, and procedure verified Lesion destroyed using liquid nitrogen: Yes   Cryotherapy cycles:  3 Outcome: patient tolerated procedure well with no complications        I, Lavonna Monarch, MD, have reviewed all documentation for this visit.  The documentation on 08/13/20 for the exam, diagnosis, procedures, and orders are all accurate and complete.

## 2020-09-26 ENCOUNTER — Encounter: Payer: Self-pay | Admitting: Dermatology

## 2020-09-26 ENCOUNTER — Ambulatory Visit: Payer: Commercial Managed Care - PPO | Admitting: Dermatology

## 2020-09-26 ENCOUNTER — Other Ambulatory Visit: Payer: Self-pay

## 2020-09-26 DIAGNOSIS — D044 Carcinoma in situ of skin of scalp and neck: Secondary | ICD-10-CM

## 2020-09-26 DIAGNOSIS — D485 Neoplasm of uncertain behavior of skin: Secondary | ICD-10-CM

## 2020-09-26 NOTE — Patient Instructions (Signed)

## 2020-10-07 ENCOUNTER — Encounter: Payer: Self-pay | Admitting: Dermatology

## 2020-10-07 NOTE — Progress Notes (Signed)
   Follow-Up Visit   Subjective  Gregory Gallagher is a 65 y.o. male who presents for the following: Follow-up (Left post neck previous ln2 the spot never went away patient has history of bcc scc and atypia ).  Growth left neck Location:  Duration:  Quality:  Associated Signs/Symptoms: Modifying Factors:  Severity:  Timing: Context:   Objective  Well appearing patient in no apparent distress; mood and affect are within normal limits. Left Postauricular Area Waxy 50m pink crust compatible with CIS       A focused examination was performed including head and neck. Relevant physical exam findings are noted in the Assessment and Plan.   Assessment & Plan    Neoplasm of uncertain behavior of skin Left Postauricular Area  Skin / nail biopsy Type of biopsy: tangential   Informed consent: discussed and consent obtained   Timeout: patient name, date of birth, surgical site, and procedure verified   Anesthesia: the lesion was anesthetized in a standard fashion   Anesthetic:  1% lidocaine w/ epinephrine 1-100,000 local infiltration Instrument used: flexible razor blade   Hemostasis achieved with: aluminum chloride and electrodesiccation   Outcome: patient tolerated procedure well   Post-procedure details: wound care instructions given    Destruction of lesion Complexity: simple   Destruction method: electrodesiccation and curettage   Informed consent: discussed and consent obtained   Timeout:  patient name, date of birth, surgical site, and procedure verified Anesthesia: the lesion was anesthetized in a standard fashion   Anesthetic:  1% lidocaine w/ epinephrine 1-100,000 local infiltration Curettage performed in three different directions: Yes   Curettage cycles:  3 Lesion length (cm):  1.2 Lesion width (cm):  1.2 Margin per side (cm):  0 Final wound size (cm):  1.2 Hemostasis achieved with:  aluminum chloride Outcome: patient tolerated procedure well with no  complications   Post-procedure details: wound care instructions given    Specimen 1 - Surgical pathology Differential Diagnosis: bcc scc curet and cautery   Check Margins: No  Shave biopsy the base of the lesion was treated with curettage plus cautery      I, SLavonna Monarch MD, have reviewed all documentation for this visit.  The documentation on 10/07/20 for the exam, diagnosis, procedures, and orders are all accurate and complete.

## 2020-11-08 ENCOUNTER — Ambulatory Visit: Payer: Commercial Managed Care - PPO | Admitting: Dermatology

## 2021-08-01 ENCOUNTER — Ambulatory Visit (INDEPENDENT_AMBULATORY_CARE_PROVIDER_SITE_OTHER): Payer: Commercial Managed Care - PPO | Admitting: Dermatology

## 2021-08-01 ENCOUNTER — Encounter: Payer: Self-pay | Admitting: Dermatology

## 2021-08-01 DIAGNOSIS — L720 Epidermal cyst: Secondary | ICD-10-CM | POA: Diagnosis not present

## 2021-08-01 DIAGNOSIS — Z1283 Encounter for screening for malignant neoplasm of skin: Secondary | ICD-10-CM

## 2021-08-01 DIAGNOSIS — L738 Other specified follicular disorders: Secondary | ICD-10-CM | POA: Diagnosis not present

## 2021-08-01 DIAGNOSIS — D1801 Hemangioma of skin and subcutaneous tissue: Secondary | ICD-10-CM

## 2021-08-25 ENCOUNTER — Encounter: Payer: Self-pay | Admitting: Dermatology

## 2021-08-25 NOTE — Progress Notes (Signed)
   Follow-Up Visit   Subjective  Gregory Gallagher is a 66 y.o. male who presents for the following: Annual Exam (Patient here today for skin check, per patient no concerns today. Personal history of atypical moles, and non mole skin cancer. No family history of atypical moles, melanoma or non mole skin cancer. ).  Annual skin examination, history of nonmelanoma skin cancers Location:  Duration:  Quality:  Associated Signs/Symptoms: Modifying Factors:  Severity:  Timing: Context:   Objective  Well appearing patient in no apparent distress; mood and affect are within normal limits. Full body skin exam.  No atypical pigmented lesions (all checked with dermoscopy).  No new or recurrent nonmelanoma skin cancer.  Head - Anterior (Face) Flesh-colored 2 mm papules with eccentric dell  Left Breast 2 Millimeter smooth red dermal papule    A full examination was performed including scalp, head, eyes, ears, nose, lips, neck, chest, axillae, abdomen, back, buttocks, bilateral upper extremities, bilateral lower extremities, hands, feet, fingers, toes, fingernails, and toenails. All findings within normal limits unless otherwise noted below.   Assessment & Plan    [place] Head - Anterior (Face)  Sebaceous hyperplasia Head - Anterior (Face)  Told of similar appearance of early BCC so if any lesion grows or bleeds return for biopsy  Hemangioma of skin Left Breast  No intervention necessary  Screening for malignant neoplasm of skin  Yearly skin exam.  Encouraged to self examine annually.  Continue ultraviolet protection.      I, Lavonna Monarch, MD, have reviewed all documentation for this visit.  The documentation on 08/25/21 for the exam, diagnosis, procedures, and orders are all accurate and complete.

## 2022-05-07 ENCOUNTER — Other Ambulatory Visit (HOSPITAL_COMMUNITY): Payer: Self-pay | Admitting: Internal Medicine

## 2022-05-07 DIAGNOSIS — E782 Mixed hyperlipidemia: Secondary | ICD-10-CM

## 2022-05-28 ENCOUNTER — Other Ambulatory Visit (HOSPITAL_COMMUNITY): Payer: Commercial Managed Care - PPO

## 2022-05-30 ENCOUNTER — Ambulatory Visit (HOSPITAL_COMMUNITY)
Admission: RE | Admit: 2022-05-30 | Discharge: 2022-05-30 | Disposition: A | Discharge status: Home / Self Care | Payer: Commercial Managed Care - PPO | Source: Ambulatory Visit | Attending: Internal Medicine | Admitting: Internal Medicine

## 2022-05-30 DIAGNOSIS — E782 Mixed hyperlipidemia: Secondary | ICD-10-CM | POA: Insufficient documentation

## 2022-08-05 ENCOUNTER — Ambulatory Visit: Payer: Commercial Managed Care - PPO | Admitting: Dermatology
